# Patient Record
Sex: Female | Born: 1960 | Race: White | Hispanic: No | Marital: Married | Smoking: Never smoker
Health system: Southern US, Community
[De-identification: ages and names within clinical notes are randomized; demographics above are authoritative.]

## PROBLEM LIST (undated history)

## (undated) DIAGNOSIS — E119 Type 2 diabetes mellitus without complications: Secondary | ICD-10-CM

## (undated) HISTORY — PX: KNEE SURGERY: SHX244

---

## 2016-08-05 ENCOUNTER — Other Ambulatory Visit: Payer: Self-pay | Admitting: Orthopedic Surgery

## 2016-08-05 DIAGNOSIS — M1711 Unilateral primary osteoarthritis, right knee: Secondary | ICD-10-CM

## 2016-08-08 ENCOUNTER — Ambulatory Visit
Admission: RE | Admit: 2016-08-08 | Discharge: 2016-08-08 | Disposition: A | Discharge status: Home / Self Care | Payer: BLUE CROSS/BLUE SHIELD | Source: Ambulatory Visit | Attending: Orthopedic Surgery | Admitting: Orthopedic Surgery

## 2016-08-08 DIAGNOSIS — M1711 Unilateral primary osteoarthritis, right knee: Secondary | ICD-10-CM

## 2021-05-06 ENCOUNTER — Other Ambulatory Visit: Payer: Self-pay

## 2021-05-06 ENCOUNTER — Emergency Department (HOSPITAL_BASED_OUTPATIENT_CLINIC_OR_DEPARTMENT_OTHER): Payer: BC Managed Care – PPO

## 2021-05-06 ENCOUNTER — Emergency Department (HOSPITAL_BASED_OUTPATIENT_CLINIC_OR_DEPARTMENT_OTHER)
Admission: EM | Admit: 2021-05-06 | Discharge: 2021-05-06 | Disposition: A | Discharge status: Home / Self Care | Payer: BC Managed Care – PPO | Attending: Emergency Medicine | Admitting: Emergency Medicine

## 2021-05-06 ENCOUNTER — Encounter (HOSPITAL_BASED_OUTPATIENT_CLINIC_OR_DEPARTMENT_OTHER): Payer: Self-pay

## 2021-05-06 DIAGNOSIS — S0181XA Laceration without foreign body of other part of head, initial encounter: Secondary | ICD-10-CM | POA: Diagnosis not present

## 2021-05-06 DIAGNOSIS — W19XXXA Unspecified fall, initial encounter: Secondary | ICD-10-CM

## 2021-05-06 DIAGNOSIS — Y9301 Activity, walking, marching and hiking: Secondary | ICD-10-CM | POA: Insufficient documentation

## 2021-05-06 DIAGNOSIS — Y92512 Supermarket, store or market as the place of occurrence of the external cause: Secondary | ICD-10-CM | POA: Diagnosis not present

## 2021-05-06 DIAGNOSIS — E119 Type 2 diabetes mellitus without complications: Secondary | ICD-10-CM | POA: Diagnosis not present

## 2021-05-06 DIAGNOSIS — S6992XA Unspecified injury of left wrist, hand and finger(s), initial encounter: Secondary | ICD-10-CM | POA: Diagnosis present

## 2021-05-06 DIAGNOSIS — S01412A Laceration without foreign body of left cheek and temporomandibular area, initial encounter: Secondary | ICD-10-CM | POA: Diagnosis not present

## 2021-05-06 DIAGNOSIS — M25521 Pain in right elbow: Secondary | ICD-10-CM | POA: Insufficient documentation

## 2021-05-06 DIAGNOSIS — S62623A Displaced fracture of medial phalanx of left middle finger, initial encounter for closed fracture: Secondary | ICD-10-CM | POA: Diagnosis not present

## 2021-05-06 DIAGNOSIS — W01198A Fall on same level from slipping, tripping and stumbling with subsequent striking against other object, initial encounter: Secondary | ICD-10-CM | POA: Diagnosis not present

## 2021-05-06 HISTORY — DX: Type 2 diabetes mellitus without complications: E11.9

## 2021-05-06 MED ORDER — LIDOCAINE-EPINEPHRINE (PF) 2 %-1:200000 IJ SOLN
10.0000 mL | Freq: Once | INTRAMUSCULAR | Status: DC
  Filled 2021-05-06: qty 20

## 2021-05-06 MED ORDER — LIDOCAINE-EPINEPHRINE-TETRACAINE (LET) TOPICAL GEL
6.0000 mL | Freq: Once | TOPICAL | Status: AC
  Administered 2021-05-06: 6 mL via TOPICAL
  Filled 2021-05-06: qty 6

## 2021-05-06 NOTE — ED Triage Notes (Signed)
Pt states she tripped/fell ~1hour PTA-hit the corner of a desk-lac x 2 to face-pain to right elbow and left middle-NAD-steady gait

## 2021-05-06 NOTE — Discharge Instructions (Addendum)
Keep lacerations dry for the next 24 hours and then you may shower and bathe as usual, you can cleanse daily with a mild soap.  Monitor for any signs of infection such as redness, swelling, increasing pain or drainage.  You do have a small fracture to the left middle finger, you should remain in splint and can follow-up with Dr. Merlyn Lot with hand surgery or with your primary care doctor for follow-up of this to ensure it is healing appropriately.  You can use Motrin and Tylenol as needed for pain.  I suspect you will be a bit sore over the next few days after this fall but the rest of your evaluation was reassuring.  Your facial lacerations were repaired with dissolvable sutures, after 7 to 10 days they should start to become brittle and should start to fall out on their own, if they are not falling out you can gently rub over them with a warm damp washcloth and they should easily come out of the skin.

## 2021-05-06 NOTE — ED Provider Notes (Signed)
[facility] EMERGENCY DEPARTMENT Provider Note   CSN: [id number] Arrival date & time: 05/06/21  1210     History Chief Complaint  Patient presents with   Fall    Debbie Morales is a 60 y.o. female.  Debbie Morales is a 60 y.o. female history of diabetes, who presents to the ED for evaluation of a fall that occurred about an hour prior to arrival.  Was helping at the [employer] and was walking and tripped falling forward and striking her face on the corner of the deck.  She has 2 lacerations to the left side of her face on the left side of her chin and above her upper lip.  She also has some mild pain to the elbow and thinks she may have hit it during the fall and has noticed pain to the left middle finger in particular when she attempts to put pressure down on her hand.  No pain in the rest of the hand or other fingers.  She reports aside from hitting her face she did not hit her head elsewhere.  No loss of consciousness.  Denies any headache, dizziness, nausea, vomiting, visual changes, numbness or weakness.  No pain in the neck or back.  No other injuries from fall.  Patient does take a daily baby aspirin but is not on any other form of anticoagulation.  Tetanus updated within the last 5 years.  No other aggravating or alleviating factors.  The history is provided by the patient, the spouse and medical records.      Past Medical History:  Diagnosis Date   Diabetes mellitus without complication (HCC)     There are no problems to display for this patient.   OB History   No obstetric history on file.     No family history on file.  Social History   Tobacco Use   Smoking status: Never   Smokeless tobacco: Never  Vaping Use   Vaping Use: Never used  Substance Use Topics   Alcohol use: Never   Drug use: Never    Home Medications Prior to Admission medications   Not on File    Allergies    Amoxil [amoxicillin]  Review of Systems   Review of  Systems  Constitutional:  Negative for chills and fever.  HENT:  Negative for facial swelling and nosebleeds.   Eyes:  Negative for pain and visual disturbance.  Respiratory:  Negative for shortness of breath.   Cardiovascular:  Negative for chest pain.  Gastrointestinal:  Negative for abdominal pain, nausea and vomiting.  Musculoskeletal:  Positive for arthralgias. Negative for back pain, joint swelling and neck pain.  Skin:  Positive for wound.  Neurological:  Negative for dizziness, syncope, weakness, light-headedness, numbness and headaches.  All other systems reviewed and are negative.  Physical Exam Updated Vital Signs BP (!) 149/73 (BP Location: Left Arm)   Pulse 92   Temp 98.8 F (37.1 C) (Oral)   Resp 20   Ht 5\' 6"  (1.676 m)   Wt 83 kg   SpO2 99%   BMI 29.54 kg/m   Physical Exam Vitals and nursing note reviewed.  Constitutional:      General: She is not in acute distress.    Appearance: Normal appearance. She is well-developed. She is obese. She is not ill-appearing or diaphoretic.  HENT:     Head: Normocephalic and atraumatic.     Comments: No hematoma, step-off or deformity over the scalp, negative battle sign  Mouth/Throat:     Comments: 2 cm laceration to the left side of the face just above the upper lip that does not involve the lip  or cross the vermilion border.  Does not extend all the way through to the oral mucosa. Patient also has a 3 cm gaping laceration just below the chin on the left side of the face with no active bleeding. No bony tenderness over the jaw, no malocclusion of the jaw, no loose teeth, or teeth that are tender to palpation.  No tenderness over the left cheek or nose. Eyes:     General:        Right eye: No discharge.        Left eye: No discharge.     Extraocular Movements: Extraocular movements intact.     Pupils: Pupils are equal, round, and reactive to light.  Neck:     Comments: No midline C-spine tenderness, normal range of  motion. Cardiovascular:     Rate and Rhythm: Normal rate and regular rhythm.     Pulses: Normal pulses.     Heart sounds: Normal heart sounds.  Pulmonary:     Effort: Pulmonary effort is normal. No respiratory distress.     Breath sounds: Normal breath sounds. No wheezing or rales.     Comments: Chest wall is nontender to palpation. Abdominal:     General: Bowel sounds are normal. There is no distension.     Palpations: Abdomen is soft. There is no mass.     Tenderness: There is no abdominal tenderness. There is no guarding.     Comments: Abdomen soft, nondistended, nontender to palpation in all quadrants without guarding or peritoneal signs  Musculoskeletal:        General: No deformity.     Cervical back: Neck supple.     Comments: No midline thoracic or lumbar spine tenderness.  There is some mild tenderness to the posterior right elbow with a very small area of swelling just medial to the olecranon, no palpable joint effusion and patient is able to fully flex and extend the elbow. There is also tenderness over the middle phalanx of the left middle finger with small area of ecchymosis at the PIP joint on the volar aspect of the finger.  Patient is able to flex and extend with some discomfort.  Normal sensation and good cap refill.  Skin:    General: Skin is warm and dry.     Capillary Refill: Capillary refill takes less than 2 seconds.  Neurological:     Mental Status: She is alert and oriented to person, place, and time.     Coordination: Coordination normal.     Comments: Speech is clear, able to follow commands CN III-XII intact Normal strength in upper and lower extremities bilaterally including dorsiflexion and plantar flexion, strong and equal grip strength Sensation normal to light and sharp touch Moves extremities without ataxia, coordination intact  Psychiatric:        Mood and Affect: Mood normal.        Behavior: Behavior normal.    ED Results / Procedures /  Treatments   Labs (all labs ordered are listed, but only abnormal results are displayed) Labs Reviewed - No data to display  EKG None  Radiology DG Elbow Complete Right  Result Date: 05/06/2021 CLINICAL DATA:  Right elbow pain and swelling after fall. EXAM: RIGHT ELBOW - COMPLETE 3+ VIEW COMPARISON:  None. FINDINGS: There is no evidence of fracture, dislocation, or joint  effusion. There is no evidence of arthropathy or other focal bone abnormality. Soft tissues are unremarkable. IMPRESSION: Negative. Electronically Signed   By: Lupita Raider M.D.   On: 05/06/2021 13:45   DG Finger Middle Left  Result Date: 05/06/2021 CLINICAL DATA:  Left middle finger pain after fall today. EXAM: LEFT MIDDLE FINGER 2+V COMPARISON:  None. FINDINGS: Minimally displaced fracture is seen involving the volar aspect of the proximal base of the third middle phalanx. No other bony abnormality is noted. Mild soft tissue swelling is noted around the proximal interphalangeal joint. IMPRESSION: Minimally displaced fracture involving volar aspect of proximal base of third middle phalanx. Electronically Signed   By: Lupita Raider M.D.   On: 05/06/2021 13:47    Procedures .Marland KitchenLaceration Repair  Date/Time: 05/06/2021 5:00 PM Performed by: Dartha Lodge, PA-C Authorized by: Dartha Lodge, PA-C   Consent:    Consent obtained:  Verbal   Consent given by:  Patient   Risks discussed:  Pain and infection Universal protocol:    Procedure explained and questions answered to patient or proxy's satisfaction: yes     Patient identity confirmed:  Verbally with patient Anesthesia:    Anesthesia method:  Topical application   Topical anesthetic:  LET Laceration details:    Location:  Face   Face location:  L cheek (Just above the upper lip)   Length (cm):  2   Depth (mm):  5 Pre-procedure details:    Preparation:  Patient was prepped and draped in usual sterile fashion Exploration:    Hemostasis achieved with:  LET    Wound extent: areolar tissue violated   Treatment:    Area cleansed with:  Saline   Amount of cleaning:  Standard   Irrigation solution:  Sterile saline   Debridement:  None Skin repair:    Repair method:  Sutures   Suture size:  6-0   Suture material:  Prolene   Suture technique:  Simple interrupted   Number of sutures:  3 Approximation:    Approximation:  Close Repair type:    Repair type:  Simple Post-procedure details:    Dressing:  Antibiotic ointment   Procedure completion:  Tolerated well, no immediate complications .Marland KitchenLaceration Repair  Date/Time: 05/06/2021 5:05 PM Performed by: Dartha Lodge, PA-C Authorized by: Dartha Lodge, PA-C   Consent:    Consent obtained:  Verbal   Consent given by:  Patient   Risks discussed:  Pain, infection, need for additional repair, poor cosmetic result and nerve damage   Alternatives discussed:  No treatment Universal protocol:    Procedure explained and questions answered to patient or proxy's satisfaction: yes     Patient identity confirmed:  Verbally with patient Anesthesia:    Anesthesia method:  Topical application   Topical anesthetic:  LET Laceration details:    Location:  Face   Face location:  Chin (Just below the lower lip on the left side of the face)   Length (cm):  3   Depth (mm):  10 Pre-procedure details:    Preparation:  Patient was prepped and draped in usual sterile fashion Exploration:    Hemostasis achieved with:  LET   Wound exploration: wound explored through full range of motion     Wound extent: areolar tissue violated   Treatment:    Area cleansed with:  Saline   Irrigation solution:  Sterile saline   Debridement:  None   Layers/structures repaired:  Deep subcutaneous Deep subcutaneous:  Suture size:  5-0   Suture material:  Vicryl   Suture technique:  Simple interrupted   Number of sutures:  1 Skin repair:    Repair method:  Sutures   Suture size:  5-0   Suture material:  Prolene    Suture technique:  Simple interrupted   Number of sutures:  4 Approximation:    Approximation:  Close Repair type:    Repair type:  Intermediate Post-procedure details:    Dressing:  Antibiotic ointment   Procedure completion:  Tolerated well, no immediate complications   Medications Ordered in ED Medications  lidocaine-EPINEPHrine (XYLOCAINE W/EPI) 2 %-1:200000 (PF) injection 10 mL (has no administration in time range)  lidocaine-EPINEPHrine-tetracaine (LET) topical gel (6 mLs Topical Given 05/06/21 1329)    ED Course  I have reviewed the triage vital signs and the nursing notes.  Pertinent labs & imaging results that were available during my care of the patient were reviewed by me and considered in my medical decision making (see chart for details).    MDM Rules/Calculators/A&P                           60 year old female presents after mechanical fall with lacerations to the left side of the face just above the upper lip and below the lower lip on the chin.  Bleeding controlled.  No surrounding bony tenderness, malocclusion of the jaw, loose, tender or missing teeth and no injury to the head elsewhere.  Given that patient only hit her mouth, did not hit her head at all and did not have any loss of consciousness, headache, vomiting, dizziness.  Has no bony tenderness do not feel she will need head or facial imaging.  She does have tenderness over the right little finger with an area of ecchymosis concerning for potential fracture will get x-ray and also has some tenderness over the right elbow.  Plain films ordered.  Tetanus is up-to-date.  Imaging of the left middle finger consistent with a mildly displaced fracture over the volar aspect of the base of the middle phalanx.  Will place in splint.  X-rays of the right elbow are unremarkable and there is some bony spurring at the tip of the olecranon process but otherwise no evidence of effusion, negative fat pad sign and no sign of  fracture.  Patient has full range of motion in the elbow, likely soreness and soft tissue injury from fall, will treat supportively.  Facial lacerations repaired with Vicryl sutures with good cosmesis.  Discussed appropriate wound care and return precautions.  Discharged home in good condition.  Hand follow-up provided regarding finger fracture.   Final Clinical Impression(s) / ED Diagnoses Final diagnoses:  Fall, initial encounter  Facial laceration, initial encounter  Displaced fracture of middle phalanx of left middle finger, initial encounter for closed fracture  Right elbow pain    Rx / DC Orders ED Discharge Orders     None        Dartha Lodge, PA-C 05/06/21 1712    Rozelle Logan, DO 05/09/21 [phone]

## 2023-02-02 IMAGING — DX DG ELBOW COMPLETE 3+V*R*
4 series · 4 of 4 positions shown · non-contrast
Comparison: None.

CLINICAL DATA: Right elbow pain and swelling after fall.

EXAM:
RIGHT ELBOW - COMPLETE 3+ VIEW

[elbow ap]
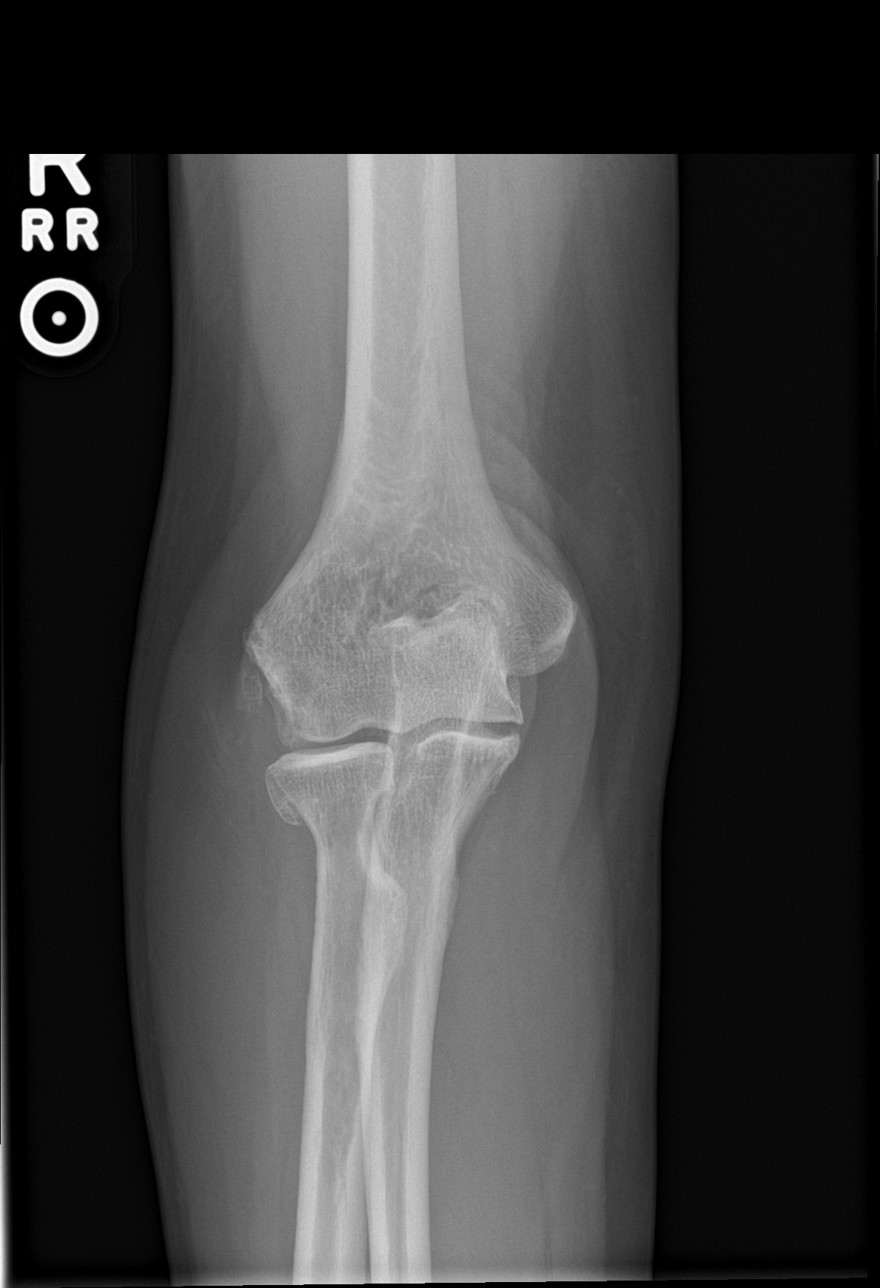

[elbow obl (1 of 2)]
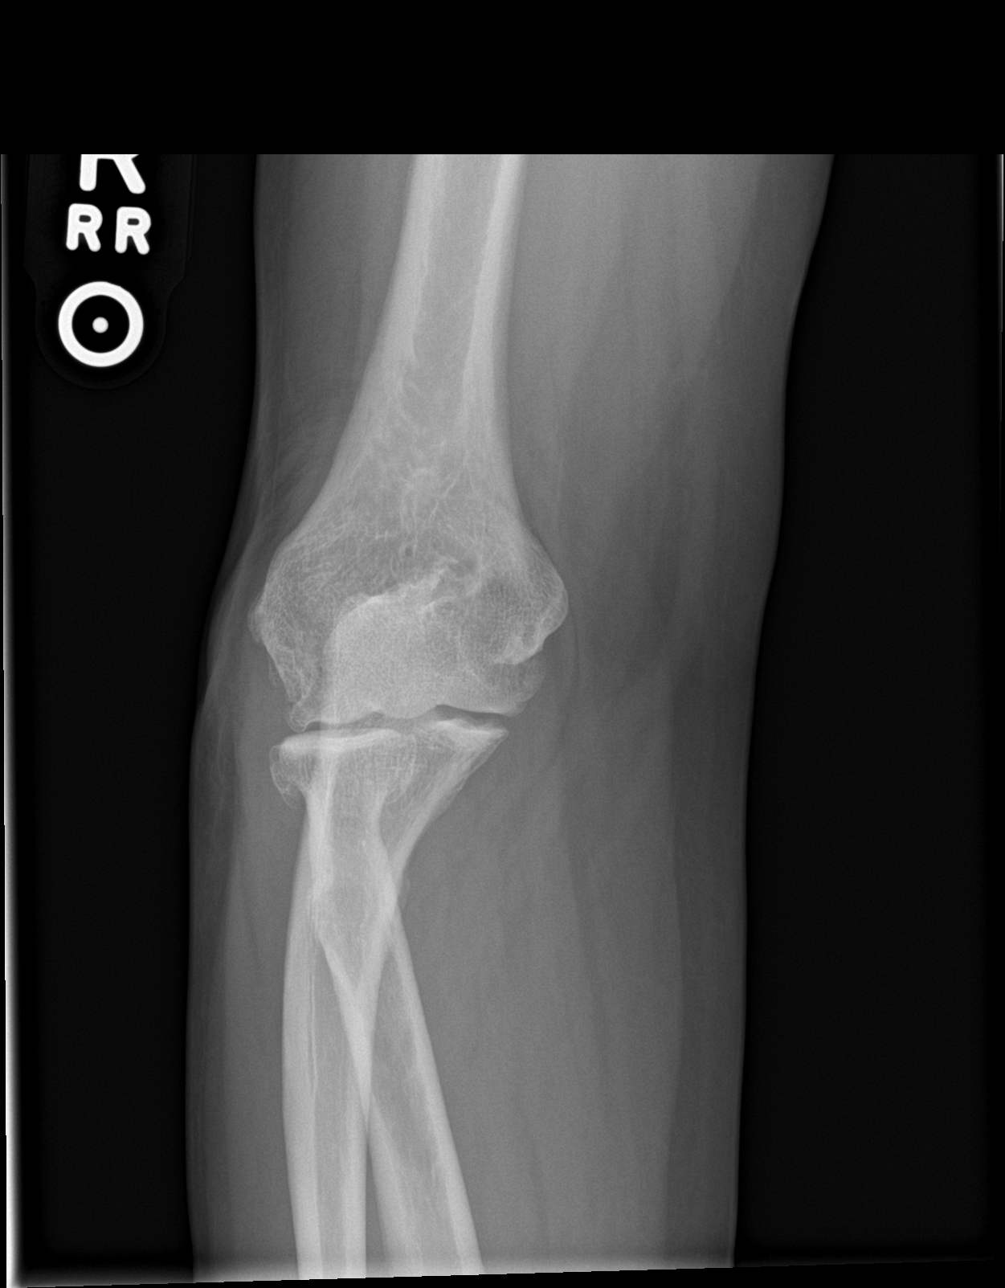

[elbow obl (2 of 2)]
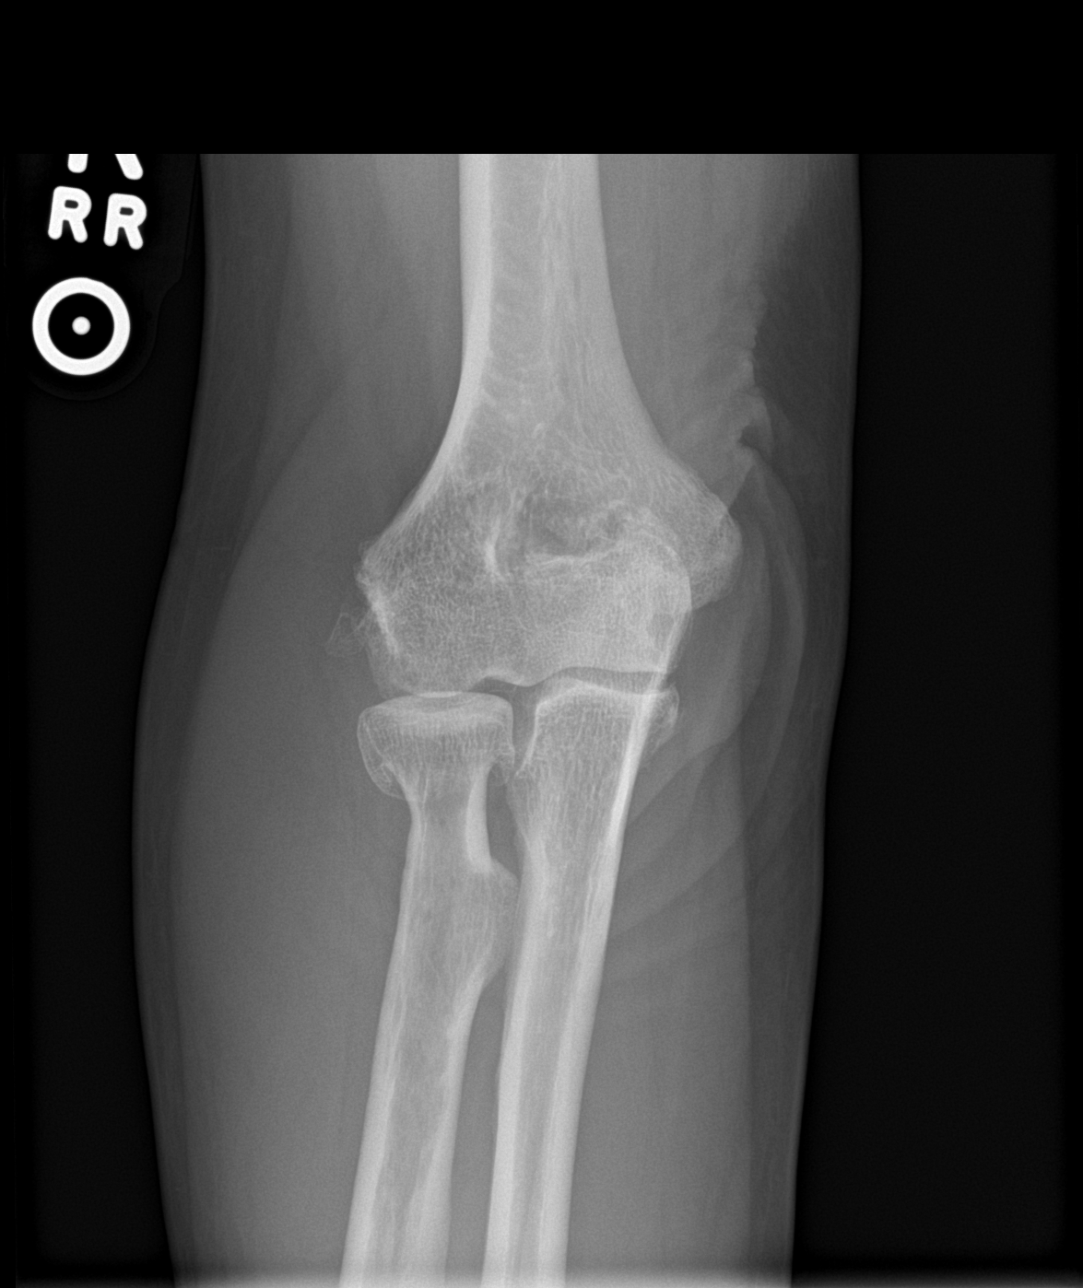

[elbow lat]
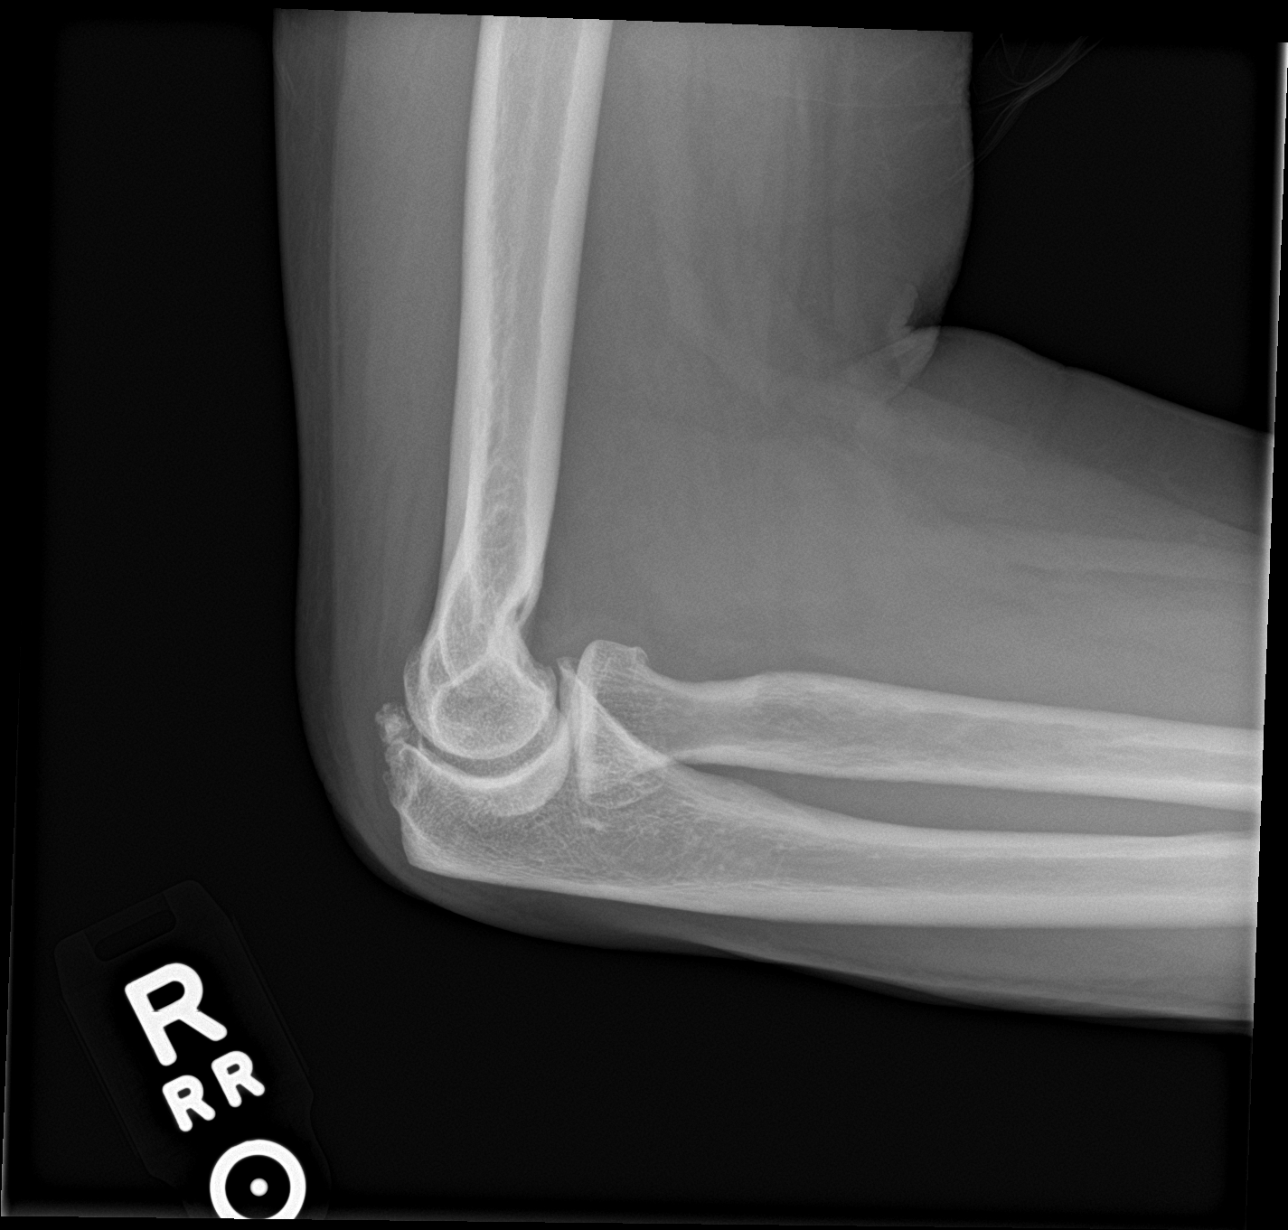

[4 of 4 positions shown; findings below may reference images not displayed]

FINDINGS: There is no evidence of fracture, dislocation, or joint effusion.
There is no evidence of arthropathy or other focal bone abnormality.
Soft tissues are unremarkable.
IMPRESSION: Negative.
